# Patient Record
Sex: Female | Born: 1970 | Race: Black or African American | Hispanic: No | Marital: Single | Smoking: Never smoker
Health system: Southern US, Community
[De-identification: ages and names within clinical notes are randomized; demographics above are authoritative.]

---

## 1999-01-06 ENCOUNTER — Emergency Department (HOSPITAL_COMMUNITY): Admission: EM | Admit: 1999-01-06 | Discharge: 1999-01-06 | Payer: Self-pay | Admitting: Emergency Medicine

## 2000-03-07 ENCOUNTER — Emergency Department (HOSPITAL_COMMUNITY): Admission: EM | Admit: 2000-03-07 | Discharge: 2000-03-07 | Payer: Self-pay | Admitting: *Deleted

## 2000-04-21 ENCOUNTER — Emergency Department (HOSPITAL_COMMUNITY): Admission: EM | Admit: 2000-04-21 | Discharge: 2000-04-21 | Payer: Self-pay | Admitting: Emergency Medicine

## 2000-04-21 ENCOUNTER — Encounter: Payer: Self-pay | Admitting: Emergency Medicine

## 2000-07-04 ENCOUNTER — Emergency Department (HOSPITAL_COMMUNITY): Admission: EM | Admit: 2000-07-04 | Discharge: 2000-07-04 | Payer: Self-pay | Admitting: Emergency Medicine

## 2001-08-26 ENCOUNTER — Emergency Department (HOSPITAL_COMMUNITY): Admission: EM | Admit: 2001-08-26 | Discharge: 2001-08-26 | Payer: Self-pay | Admitting: Emergency Medicine

## 2001-08-26 ENCOUNTER — Encounter: Payer: Self-pay | Admitting: Emergency Medicine

## 2021-05-27 ENCOUNTER — Encounter (HOSPITAL_COMMUNITY): Payer: Self-pay | Admitting: Student

## 2021-05-27 ENCOUNTER — Emergency Department (HOSPITAL_COMMUNITY): Payer: No Typology Code available for payment source

## 2021-05-27 ENCOUNTER — Other Ambulatory Visit: Payer: Self-pay

## 2021-05-27 ENCOUNTER — Emergency Department (HOSPITAL_COMMUNITY)
Admission: EM | Admit: 2021-05-27 | Discharge: 2021-05-27 | Disposition: A | Discharge status: Home / Self Care | Payer: No Typology Code available for payment source | Attending: Emergency Medicine | Admitting: Emergency Medicine

## 2021-05-27 DIAGNOSIS — Z23 Encounter for immunization: Secondary | ICD-10-CM | POA: Diagnosis not present

## 2021-05-27 DIAGNOSIS — W230XXA Caught, crushed, jammed, or pinched between moving objects, initial encounter: Secondary | ICD-10-CM | POA: Diagnosis not present

## 2021-05-27 DIAGNOSIS — S42441B Displaced fracture (avulsion) of medial epicondyle of right humerus, initial encounter for open fracture: Secondary | ICD-10-CM | POA: Diagnosis not present

## 2021-05-27 DIAGNOSIS — S51011A Laceration without foreign body of right elbow, initial encounter: Secondary | ICD-10-CM | POA: Insufficient documentation

## 2021-05-27 DIAGNOSIS — Z20822 Contact with and (suspected) exposure to covid-19: Secondary | ICD-10-CM | POA: Diagnosis not present

## 2021-05-27 DIAGNOSIS — Y990 Civilian activity done for income or pay: Secondary | ICD-10-CM | POA: Insufficient documentation

## 2021-05-27 DIAGNOSIS — S61411A Laceration without foreign body of right hand, initial encounter: Secondary | ICD-10-CM | POA: Insufficient documentation

## 2021-05-27 DIAGNOSIS — S4991XA Unspecified injury of right shoulder and upper arm, initial encounter: Secondary | ICD-10-CM | POA: Diagnosis present

## 2021-05-27 LAB — RESP PANEL BY RT-PCR (FLU A&B, COVID) ARPGX2
Influenza A by PCR: NEGATIVE
Influenza B by PCR: NEGATIVE
SARS Coronavirus 2 by RT PCR: NEGATIVE

## 2021-05-27 MED ORDER — TETANUS-DIPHTH-ACELL PERTUSSIS 5-2.5-18.5 LF-MCG/0.5 IM SUSY
0.5000 mL | PREFILLED_SYRINGE | Freq: Once | INTRAMUSCULAR | Status: AC
  Administered 2021-05-27: 0.5 mL via INTRAMUSCULAR
  Filled 2021-05-27: qty 0.5

## 2021-05-27 MED ORDER — CEPHALEXIN 500 MG PO CAPS: 500.0000 mg | ORAL_CAPSULE | Freq: Three times a day (TID) | ORAL | 0 refills | Status: AC

## 2021-05-27 MED ORDER — CEFAZOLIN SODIUM-DEXTROSE 2-4 GM/100ML-% IV SOLN
2.0000 g | Freq: Once | INTRAVENOUS | Status: AC
  Administered 2021-05-27: 2 g via INTRAVENOUS
  Filled 2021-05-27: qty 100

## 2021-05-27 MED ORDER — HYDROMORPHONE HCL 1 MG/ML IJ SOLN
0.5000 mg | Freq: Once | INTRAMUSCULAR | Status: AC
  Administered 2021-05-27: 0.5 mg via INTRAVENOUS
  Filled 2021-05-27: qty 1

## 2021-05-27 MED ORDER — ONDANSETRON 4 MG PO TBDP: 4.0000 mg | ORAL_TABLET | Freq: Three times a day (TID) | ORAL | 0 refills | Status: AC | PRN

## 2021-05-27 MED ORDER — POVIDONE-IODINE 10 % EX SOLN
Freq: Once | CUTANEOUS | Status: AC
  Filled 2021-05-27: qty 118

## 2021-05-27 MED ORDER — ONDANSETRON HCL 4 MG/2ML IJ SOLN
4.0000 mg | Freq: Once | INTRAMUSCULAR | Status: AC
  Administered 2021-05-27: 4 mg via INTRAVENOUS
  Filled 2021-05-27: qty 2

## 2021-05-27 MED ORDER — LIDOCAINE HCL 2 % IJ SOLN
20.0000 mL | Freq: Once | INTRAMUSCULAR | Status: DC
  Filled 2021-05-27: qty 20

## 2021-05-27 MED ORDER — HYDROCODONE-ACETAMINOPHEN 5-325 MG PO TABS: 1.0000 | ORAL_TABLET | Freq: Four times a day (QID) | ORAL | 0 refills | Status: AC | PRN

## 2021-05-27 MED ORDER — LIDOCAINE HCL (PF) 1 % IJ SOLN
INTRAMUSCULAR | Status: AC
  Filled 2021-05-27: qty 30

## 2021-05-27 NOTE — Progress Notes (Signed)
Orthopedic Tech Progress Note Patient Details:  Jaryah Aracena 08-Jan-1971 [id number]  Ortho Devices Type of Ortho Device: Long arm splint, Shoulder immobilizer Ortho Device/Splint Location: RUE Ortho Device/Splint Interventions: Ordered, Application, Adjustment   Post Interventions Patient Tolerated: Well Instructions Provided: Care of device, Poper ambulation with device, Adjustment of device  Jelena Malicoat 05/27/2021, 8:05 AM

## 2021-05-27 NOTE — ED Triage Notes (Signed)
Pt was at work and right arm was caught in a roller, pt has a large laceration to palmar side of right hand, and a smaller laceration to right upper arm. Bleeding controlled, dressing applied.

## 2021-05-27 NOTE — ED Provider Notes (Signed)
[facility] Provider Note   CSN: [id number] Arrival date & time: 05/27/21  0055    History Chief Complaint  Patient presents with   Hand Injury    Diana Sellers is a 50 y.o. female without significant past medical hx who presents to the ED for RUE injury that occurred 30 minutes PTA. Patient reports she was at work when her right upper extremity from the hand to the elbow got caught between two moving rollers resulting in injury. She has wounds to her elbow and more prominently to her hand with associated pain/swelling. Worse with movement. No alleviating factors. Denies numbness, weakness, or other areas of injury. Patient is R hand dominant. Unknown last tetanus. Last food intake was in the afternoon.   HPI     History reviewed. No pertinent past medical history.  There are no problems to display for this patient.   History reviewed. No pertinent surgical history.   OB History   No obstetric history on file.     History reviewed. No pertinent family history.     Home Medications Prior to Admission medications   Not on File    Allergies    Patient has no allergy information on record.  Review of Systems   Review of Systems  Constitutional:  Negative for chills and fever.  Respiratory:  Negative for shortness of breath.   Cardiovascular:  Negative for chest pain.  Gastrointestinal:  Negative for abdominal pain.  Musculoskeletal:  Positive for arthralgias, joint swelling and myalgias.  Skin:  Positive for wound.  Neurological:  Negative for weakness and numbness.  All other systems reviewed and are negative.  Physical Exam Updated Vital Signs BP 134/81   Pulse 75   Temp 98.9 F (37.2 C) (Oral)   Resp 16   SpO2 100%   Physical Exam Vitals and nursing note reviewed.  Constitutional:      General: She is not in acute distress.    Appearance: Normal appearance. She is well-developed. She is not ill-appearing or  toxic-appearing.  HENT:     Head: Normocephalic and atraumatic.  Eyes:     General:        Right eye: No discharge.        Left eye: No discharge.     Conjunctiva/sclera: Conjunctivae normal.  Neck:     Comments: No midline tenderness.  Cardiovascular:     Rate and Rhythm: Normal rate.     Pulses:          Radial pulses are 2+ on the right side and 2+ on the left side.     Comments: 2+ radial and ulnar pulses bilaterally. Pulmonary:     Effort: Pulmonary effort is normal. No respiratory distress.     Breath sounds: Normal breath sounds.  Abdominal:     General: There is no distension.  Musculoskeletal:     Cervical back: Normal range of motion and neck supple.     Comments: Upper extremities: RUE- R hand and elbow are swollen.. Patient has a 4 cm wound to the ulnar aspect of the distal humerus that is approximately 3 mm deep- no obvious palpable bone fragements. Large gaping wound from the ulnar wrist extending to the webspace between the 1st & 2nd digit that is 14 cm and is 1 cm in depth at deepest portion.. Muscle belly at the thenar eminence is visible however appears intact. Some fascia involvement. No obvious tendon injury. Both wounds are without obvious FB and  without pulsatile bleeding, mild ooze from each wound without dressing in place. Patient has full intact ROM to the right elbow and wrist, able to flex/extend all ICP/MCP joints, very mild limitation with full extension as patient states this is due to swelling. Able to flex/extend all MCP/IP joints against resistance. Able to perform OK sign, thumbs up, and cross 2nd/3rd digits bilaterally. Tender to the right eblow, forearm, and hand diffusely. No tenderness to the digits. Compartments are soft.   Skin:    General: Skin is warm and dry.     Capillary Refill: Capillary refill takes less than 2 seconds.  Neurological:     Mental Status: She is alert.     Comments: Clear speech.   Psychiatric:        Mood and Affect: Mood  normal.        Behavior: Behavior normal.        Thought Content: Thought content normal.         ED Results / Procedures / Treatments   Labs (all labs ordered are listed, but only abnormal results are displayed) Labs Reviewed  RESP PANEL BY RT-PCR (FLU A&B, COVID) [id number]    EKG None  Radiology DG Elbow Complete Right  Result Date: 05/27/2021 CLINICAL DATA:  Arm caught in roller with elbow pain, initial encounter EXAM: RIGHT ELBOW - COMPLETE 3+ VIEW COMPARISON:  None. FINDINGS: Fragmentation is noted along the medial epicondyle with associated soft tissue injury consistent with the given clinical history. No other fractures are seen. No other soft tissue abnormality noted. IMPRESSION: Fragmentation of the medial epicondyle with associated soft tissue injury. Electronically Signed   By: Alcide Clever M.D.   On: 05/27/2021 02:26   DG Forearm Right  Result Date: 05/27/2021 CLINICAL DATA:  Right arm caught in roller with pain and soft tissue lacerations, initial encounter EXAM: RIGHT FOREARM - 2 VIEW COMPARISON:  None. FINDINGS: Irregularity is noted along the medial epicondyle of the distal humerus consistent with localized fracture. The radius and ulna are within normal limits. No joint effusion is seen. IMPRESSION: Fracture involving the distal right humerus. No radius or ulnar abnormality is seen. Electronically Signed   By: Alcide Clever M.D.   On: 05/27/2021 02:26   DG Hand Complete Right  Result Date: 05/27/2021 CLINICAL DATA:  Right arm caught in roller with hand and forearm pain, initial encounter EXAM: RIGHT HAND - COMPLETE 3+ VIEW COMPARISON:  None. FINDINGS: Soft tissue injury is noted along the palmar aspect of the hand. No acute fracture or dislocation is noted. No other focal abnormality is seen. IMPRESSION: Soft tissue injury without acute bony abnormality. Electronically Signed   By: Alcide Clever M.D.   On: 05/27/2021 02:25    Procedures .Marland KitchenLaceration Repair  Date/Time:  05/27/2021 6:55 AM Performed by: Cherly Anderson, PA-C Authorized by: Cherly Anderson, PA-C   Consent:    Consent obtained:  Verbal   Consent given by:  Patient   Risks, benefits, and alternatives were discussed: yes     Risks discussed:  Infection, need for additional repair, poor wound healing, poor cosmetic result, pain, retained foreign body, tendon damage, vascular damage and nerve damage   Alternatives discussed:  No treatment Anesthesia:    Anesthesia method:  Local infiltration   Local anesthetic:  Lidocaine 1% w/o epi Laceration details:    Location:  Shoulder/arm   Shoulder/arm location:  R elbow   Length (cm):  4   Depth (mm):  3 Pre-procedure details:  Preparation:  Patient was prepped and draped in usual sterile fashion and imaging obtained to evaluate for foreign bodies Exploration:    Hemostasis achieved with:  Direct pressure   Imaging obtained: x-ray     Imaging outcome: foreign body not noted     Wound exploration: wound explored through full range of motion and entire depth of wound visualized     Contaminated: no   Treatment:    Area cleansed with:  Povidone-iodine   Amount of cleaning:  Standard   Irrigation solution:  Sterile water   Irrigation method:  Pressure wash   Visualized foreign bodies/material removed: no   Skin repair:    Repair method:  Sutures   Suture size:  4-0   Suture material:  Nylon   Suture technique:  Simple interrupted   Number of sutures:  7 Approximation:    Approximation:  Close Repair type:    Repair type:  Simple Post-procedure details:    Dressing:  Non-adherent dressing   Procedure completion:  Tolerated well, no immediate complications .Marland KitchenLaceration Repair  Date/Time: 05/27/2021 6:56 AM Performed by: Cherly Anderson, PA-C Authorized by: Cherly Anderson, PA-C   Consent:    Consent obtained:  Verbal   Consent given by:  Patient   Risks, benefits, and alternatives were discussed: yes      Risks discussed:  Infection, need for additional repair, nerve damage, poor wound healing, poor cosmetic result, pain and retained foreign body   Alternatives discussed:  No treatment Anesthesia:    Anesthesia method:  Local infiltration   Local anesthetic:  Lidocaine 1% w/o epi Laceration details:    Location:  Hand   Hand location:  R palm   Length (cm):  14   Depth (mm):  10 Pre-procedure details:    Preparation:  Imaging obtained to evaluate for foreign bodies and patient was prepped and draped in usual sterile fashion Exploration:    Hemostasis achieved with:  Direct pressure   Imaging obtained: x-ray     Imaging outcome: foreign body not noted     Wound exploration: wound explored through full range of motion and entire depth of wound visualized     Wound extent: fascia violated     Contaminated: no   Treatment:    Area cleansed with:  Povidone-iodine   Amount of cleaning:  Standard   Irrigation solution:  Sterile water   Irrigation volume:  1.5L   Irrigation method:  Pressure wash Skin repair:    Repair method:  Sutures   Suture size:  5-0   Suture material:  Chromic gut   Suture technique:  Simple interrupted   Number of sutures:  25 Repair type:    Repair type:  Intermediate Post-procedure details:    Dressing:  Non-adherent dressing   Procedure completion:  Tolerated well, no immediate complications Comments:     25 simple interrupted chromic gut sutures placed.  Additionally placed 1 horizontal mattress suture with 4-0 nylon to the ventral aspect of the wrist at the base of the palm to help with approximation.   Medications Ordered in ED Medications  lidocaine (PF) (XYLOCAINE) 1 % injection (has no administration in time range)  ceFAZolin (ANCEF) IVPB 2g/100 mL premix (has no administration in time range)  Tdap (BOOSTRIX) injection 0.5 mL (0.5 mLs Intramuscular Given 05/27/21 0157)  HYDROmorphone (DILAUDID) injection 0.5 mg (0.5 mg Intravenous Given 05/27/21 0157)   ondansetron (ZOFRAN) injection 4 mg (4 mg Intravenous Given 05/27/21 0217)    ED  Course  I have reviewed the triage vital signs and the nursing notes.  Pertinent labs & imaging results that were available during my care of the patient were reviewed by me and considered in my medical decision making (see chart for details).    MDM Rules/Calculators/A&P                           Patient presents to the ED with complaints of right upper extremity injury.  Patient has a large complicated laceration to the right palm as well as a small laceration to the right medial distal humerus.  Neurovascularly intact distally.  X-rays ordered.  Tetanus updated.  Additional history obtained:  Additional history obtained from chart review & nursing note review.   Imaging Studies ordered:  I ordered imaging studies which included right hand, forearm, and elbow x-rays, I independently reviewed, formal radiology impression shows:  Right hand:Soft tissue injury without acute bony abnormality. Right forearm: Fracture involving the distal right humerus. No radius or ulnar abnormality is see Right elbow: Fragmentation of the medial epicondyle with associated soft tissue injury  ED Course:  Will discuss with orthopedic surgery regarding open medial epicondyle fracture.  Ancef ordered.  03:12: CONSULT: Discussed with Dr. Jena Gauss, reviewed imaging, does not feel patient needs to go to the operating room for his medial epicondyle fracture-recommends washout in the emergency department with closure utilizing nylon-we will see patient in clinic.  Supervising physician Dr. Manus Gunning discussed with hand surgeon Dr. Arita Miss who has recommended closure in the emergency department with chromic gut sutures, only need for single layer of sutures for closure.   Wounds were repaired per procedure notes above.  Patient tolerated the procedure well.  Neurovascular intact prior to and following laceration repairs.  Exam does not  appear clinically consistent with compartment syndrome at this time, Nonstick dressings were applied with posterior long-arm splint and sling.  Will discharge home with analgesics, antiemetics, and Keflex. [place] Controlled Substance reporting System queried.  Patient to follow-up with Dr. Arita Miss and Dr. Jena Gauss outpatient.  Strict return precautions.  I discussed results, treatment plan, need for follow-up, and return precautions with the patient. Provided opportunity for questions, patient confirmed understanding and is in agreement with plan.    This is a shared visit with supervising physician Dr. Manus Gunning who has independently evaluated patient & provided guidance in evaluation/management/disposition, in agreement with care   Portions of this note were generated with Dragon dictation software. Dictation errors may occur despite best attempts at proofreading.  Final Clinical Impression(s) / ED Diagnoses Final diagnoses:  Laceration of right palm, initial encounter  Laceration of right elbow, initial encounter  Open displaced fracture of medial epicondyle of right humerus, unspecified fracture morphology, initial encounter    Rx / DC Orders ED Discharge Orders          Ordered    HYDROcodone-acetaminophen (NORCO/VICODIN) 5-325 MG tablet  Every 6 hours PRN        05/27/21 0653    ondansetron (ZOFRAN ODT) 4 MG disintegrating tablet  Every 8 hours PRN        05/27/21 0653    cephALEXin (KEFLEX) 500 MG capsule  3 times daily        05/27/21 0653             Cherly Anderson, PA-C 05/27/21 0706    Glynn Octave, MD 05/28/21 [phone]

## 2021-05-27 NOTE — ED Notes (Signed)
Nonadherent and cling wrap dressing applied to wound.

## 2021-05-27 NOTE — Discharge Instructions (Addendum)
Please read and follow all provided instructions.  You have been seen today after an injury to your right upper extremity Your xray shows that you have fractured your medial epicondyle of your right humerus-this is near the funny bone location. You also had wounds to your right elbow and to your right hand.  Your right elbow wound was closed with nonabsorbable sutures, these will need to be removed in 7 to 10 days.  Your right hand wound was closed with absorbable sutures, these will come out on their own- there is one non-absorbable stitch in your hand that will need to be removed in 7-10 days.  We have placed dressings on the wounds underneath your splint. We have placed you in a splint- please keep this clean & dry and intact until you have followed up with the below specialist. Please call Dr. Thomos Lemons office with plastic surgery tomorrow morning to schedule soonest available follow-up for further evaluation and management of your complicated right hand wound, please call Dr. Luvenia Starch office with orthopedics tomorrow morning to schedule soonest available appointment for further management of your right elbow wound and fracture.  Home care instructions: -- *PRICE in the first 24-48 hours after injury: Protect with splint Rest Ice- Do not apply ice pack directly to your splint place towel or similar between your splint and ice/ice pack. Apply ice for 20 min, then remove for 40 min while awake Compression- splint Elevate affected extremity above the level of your heart when not walking around for the first 24-48 hours   Medications:  Please take ibuprofen per over the counter dosing to help with pain/swelling.  If your pain is not alleviated by ibuprofen please take percocet.  -Norco-this is a narcotic/controlled substance medication that has potential addicting qualities.  We recommend that you take 1-2 tablets every 6 hours as needed for severe pain.  Do not drive or operate heavy machinery when  taking this medicine as it can be sedating. Do not drink alcohol or take other sedating medications when taking this medicine for safety reasons.  Keep this out of reach of small children.  Please be aware this medicine has Tylenol in it (325 mg/tab) do not exceed the maximum dose of Tylenol in a day per over the counter recommendations should you decide to supplement with Tylenol over the counter.  - Zofran-this is an antinausea medication, please take this every 8 hours as needed. -Keflex- this is an antibiotic to help prevent infection.  Please take this 3 times per day for the next 7 days.  We have prescribed you new medication(s) today. Discuss the medications prescribed today with your pharmacist as they can have adverse effects and interactions with your other medicines including over the counter and prescribed medications. Seek medical evaluation if you start to experience new or abnormal symptoms after taking one of these medicines, seek care immediately if you start to experience difficulty breathing, feeling of your throat closing, facial swelling, or rash as these could be indications of a more serious allergic reaction   Follow-up instructions: Please call Dr. Thomos Lemons and Dr. Luvenia Starch office as above.   Return instructions:  Please return if your digits or extremity are numb or tingling, appear gray or blue, or you have severe pain (also elevate the extremity and loosen splint or wrap if you were given one) Please return if you have redness or fevers.  Please return to the Emergency Department if you experience worsening symptoms.  Please return if you have any  other emergent concerns. Additional Information:  Your vital signs today were: BP (!) 142/90   Pulse 79   Temp 98.9 F (37.2 C) (Oral)   Resp 18   SpO2 100%  If your blood pressure (BP) was elevated above 135/85 this visit, please have this repeated by your doctor within one month. ---------------

## 2022-01-05 ENCOUNTER — Other Ambulatory Visit: Payer: Self-pay

## 2022-01-05 ENCOUNTER — Encounter (HOSPITAL_COMMUNITY): Payer: Self-pay

## 2022-01-05 ENCOUNTER — Emergency Department (HOSPITAL_COMMUNITY)
Admission: EM | Admit: 2022-01-05 | Discharge: 2022-01-05 | Discharge status: Against Medical Advice | Payer: No Typology Code available for payment source | Attending: Emergency Medicine | Admitting: Emergency Medicine

## 2022-01-05 DIAGNOSIS — Z5321 Procedure and treatment not carried out due to patient leaving prior to being seen by health care provider: Secondary | ICD-10-CM | POA: Diagnosis not present

## 2022-01-05 DIAGNOSIS — M79641 Pain in right hand: Secondary | ICD-10-CM | POA: Insufficient documentation

## 2022-01-05 NOTE — ED Notes (Signed)
Patient gave registration their labels and stated they were leaving ?

## 2022-01-05 NOTE — ED Triage Notes (Signed)
Pt arrived POV from home c/o chronic hand pain for a couple months.  ?

## 2022-01-05 NOTE — ED Provider Triage Note (Signed)
Emergency Medicine Provider Triage Evaluation Note ? ?Jalissa Heinzelman , a 51 y.o. female  was evaluated in triage.  Pt complains of chronic right hand pain.  Patient had a second injury in the month of August 2022, reports she has not been returning back to work.  Now is having worsening pain to the right hand, although there was no injury that occurred.  She does report missing work today, was told that she would need a doctor's note in order to return back to work for her ongoing hand pain. ? ?Review of Systems  ?Positive: Right hand pain ?Negative: Trauma, fever ? ?Physical Exam  ?There were no vitals taken for this visit. ?Gen:   Awake, no distress   ?Resp:  Normal effort  ?MSK:   Moves extremities without difficulty  ?Other:  Decrease hand flexion, pulses present, capillary refill intact, surgical scar present ? ?Medical Decision Making  ?Medically screening exam initiated at 2:43 PM.  Appropriate orders placed.  Kendi Defalco was informed that the remainder of the evaluation will be completed by another provider, this initial triage assessment does not replace that evaluation, and the importance of remaining in the ED until their evaluation is complete. ? ? ?  ?Claude Manges, PA-C ?01/05/22 1454 ? ?

## 2022-01-06 ENCOUNTER — Ambulatory Visit (HOSPITAL_COMMUNITY)
Admission: EM | Admit: 2022-01-06 | Discharge: 2022-01-06 | Disposition: A | Discharge status: Home / Self Care | Payer: Self-pay | Attending: Family Medicine | Admitting: Family Medicine

## 2022-01-06 ENCOUNTER — Encounter (HOSPITAL_COMMUNITY): Payer: Self-pay | Admitting: Emergency Medicine

## 2022-01-06 ENCOUNTER — Other Ambulatory Visit: Payer: Self-pay

## 2022-01-06 DIAGNOSIS — M79641 Pain in right hand: Secondary | ICD-10-CM

## 2022-01-06 NOTE — ED Provider Notes (Signed)
? ?  [facility] ?Provider Note ? ?Patient Contact: 1:41 PM (approximate) ? ? ?History  ? ?Hand Pain ? ? ?HPI ? ?Diana Sellers is a 51 y.o. female presents to the urgent care for a return to work note.  Patient has no current medical complaints right now. ? ?  ? ? ?Physical Exam  ? ?Triage Vital Signs: ?ED Triage Vitals  ?Enc Vitals Group  ?   BP 01/06/22 1331 (!) 165/107  ?   Pulse Rate 01/06/22 1331 81  ?   Resp 01/06/22 1331 18  ?   Temp 01/06/22 1331 98.3 ?F (36.8 ?C)  ?   Temp Source 01/06/22 1331 Oral  ?   SpO2 01/06/22 1331 99 %  ?   Weight --   ?   Height --   ?   Head Circumference --   ?   Peak Flow --   ?   Pain Score 01/06/22 1328 6  ?   Pain Loc --   ?   Pain Edu? --   ?   Excl. in GC? --   ? ? ?Most recent vital signs: ?Vitals:  ? 01/06/22 1331  ?BP: (!) 165/107  ?Pulse: 81  ?Resp: 18  ?Temp: 98.3 ?F (36.8 ?C)  ?SpO2: 99%  ? ? ? ?General: Alert and in no acute distress. ?Eyes:  PERRL. EOMI. ?Head: No acute traumatic findings ?ENT: ?     Ears:  ?     Nose: No congestion/rhinnorhea. ?     Mouth/Throat: Mucous membranes are moist.  ?Neck: No stridor. No cervical spine tenderness to palpation. ?Cardiovascular:  Good peripheral perfusion ?Respiratory: Normal respiratory effort without tachypnea or retractions. Lungs CTAB. Good air entry to the bases with no decreased or absent breath sounds. ?Gastrointestinal: Bowel sounds ?4 quadrants. Soft and nontender to palpation. No guarding or rigidity. No palpable masses. No distention. No CVA tenderness. ?Musculoskeletal: Full range of motion to all extremities.  ?Neurologic:  No gross focal neurologic deficits are appreciated.  ?Skin:   No rash noted ?Other: ? ? ?ED Results / Procedures / Treatments  ? ?Labs ?(all labs ordered are listed, but only abnormal results are displayed) ?Labs Reviewed - No data to display ? ? ? ? ? ? ? ?PROCEDURES: ? ?Critical Care performed: No ? ?Procedures ? ? ?MEDICATIONS ORDERED IN ED: ?Medications - No data to  display ? ? ?IMPRESSION / MDM / ASSESSMENT AND PLAN / ED COURSE  ?I reviewed the triage vital signs and the nursing notes. ?             ?               ? ?Differential diagnosis includes, but is not limited to, return to work evaluation ?51 year old female presents to the urgent care for a return to work note.  Work note was given. ?  ? ? ?FINAL CLINICAL IMPRESSION(S) / ED DIAGNOSES  ? ?Final diagnoses:  ?Right hand pain  ? ? ? ?Rx / DC Orders  ? ?ED Discharge Orders   ? ? None  ? ?  ? ? ? ?Note:  This document was prepared using Dragon voice recognition software and may include unintentional dictation errors. ?  ?Orvil Feil, PA-C ?01/06/22 1343 ? ?

## 2022-01-06 NOTE — ED Triage Notes (Signed)
Patient has an old injury to the right hand.  Right hand pain is reported as being constant since injury.  Patient has sensation of pins and needles.  Patient has an appt on 4/18.  Patient is requesting a note to go back to work.  States she did not go yesterday and she is not going tonight.  ?

## 2022-04-29 IMAGING — DX DG ELBOW COMPLETE 3+V*R*
4 series · 4 of 4 positions shown · non-contrast
Comparison: None.

CLINICAL DATA: Arm caught in roller with elbow pain, initial
encounter

EXAM:
RIGHT ELBOW - COMPLETE 3+ VIEW

[elbow ap]
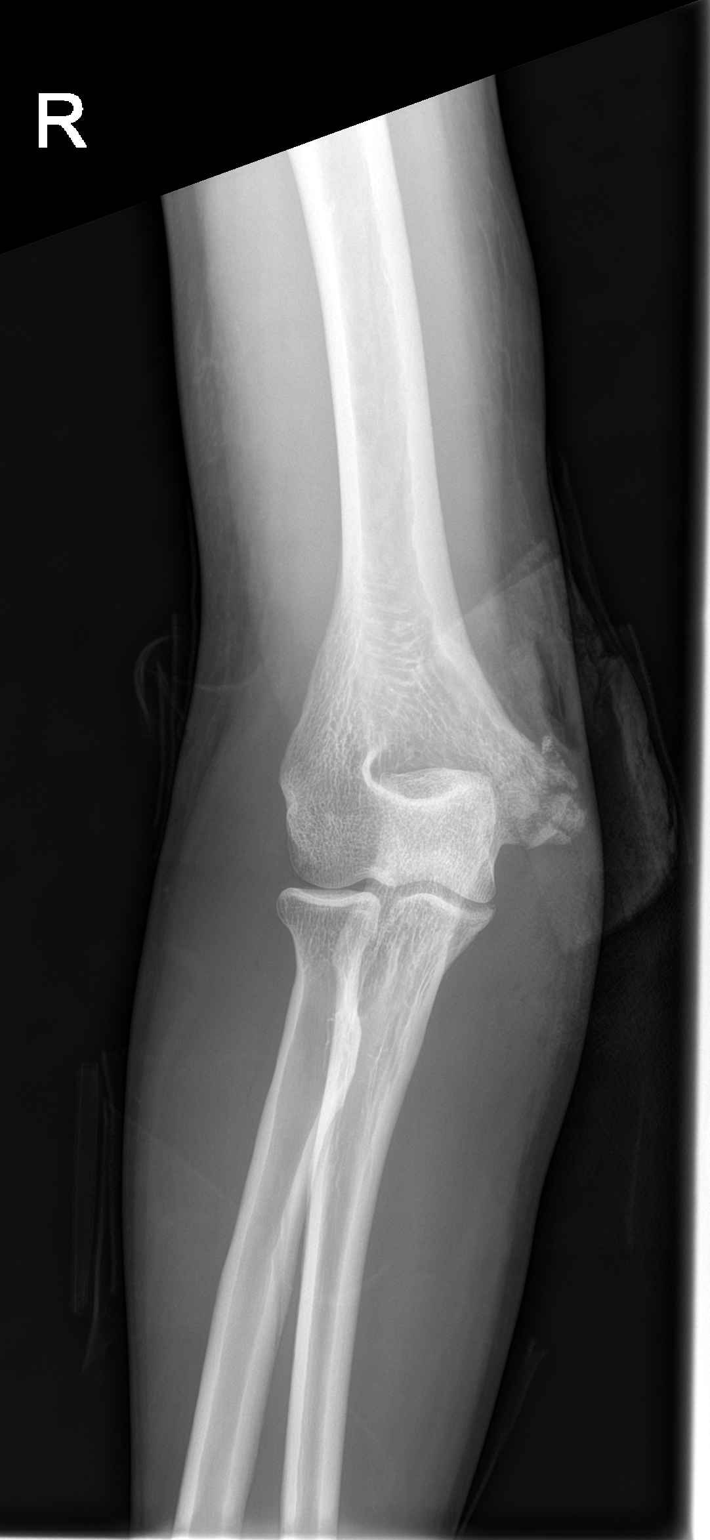

[elbow obl (1 of 2)]
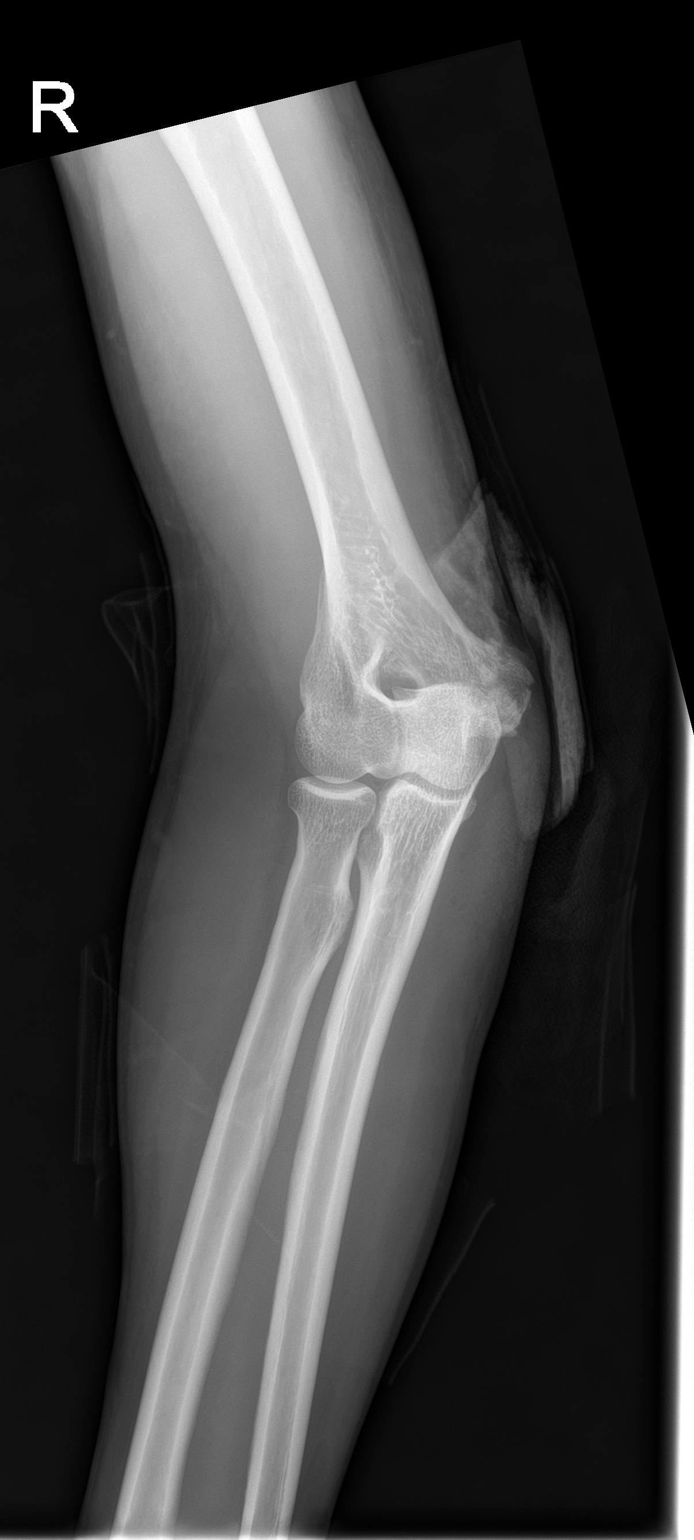

[elbow obl (2 of 2)]
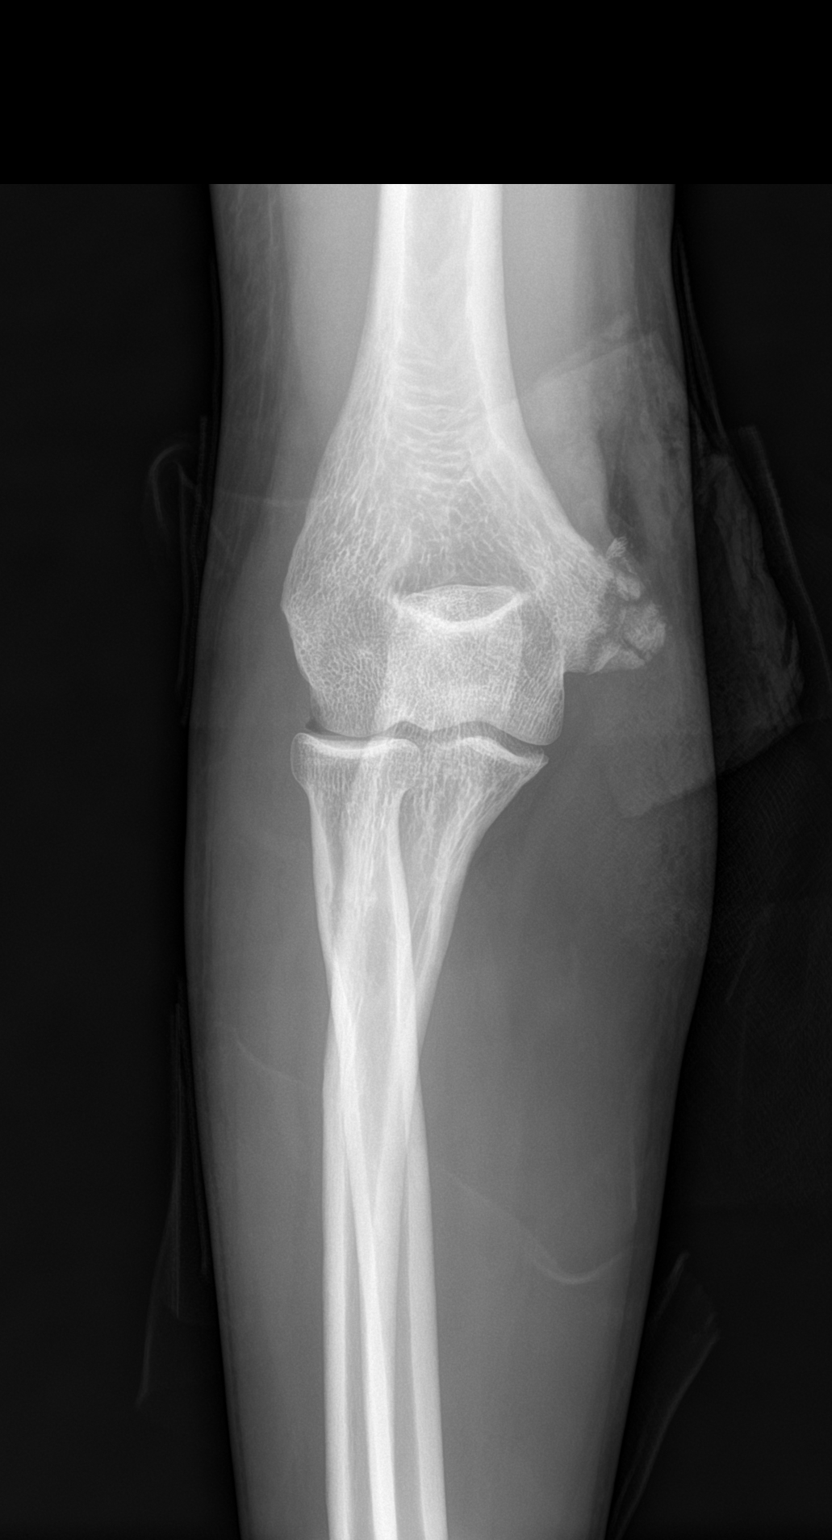

[elbow lat]
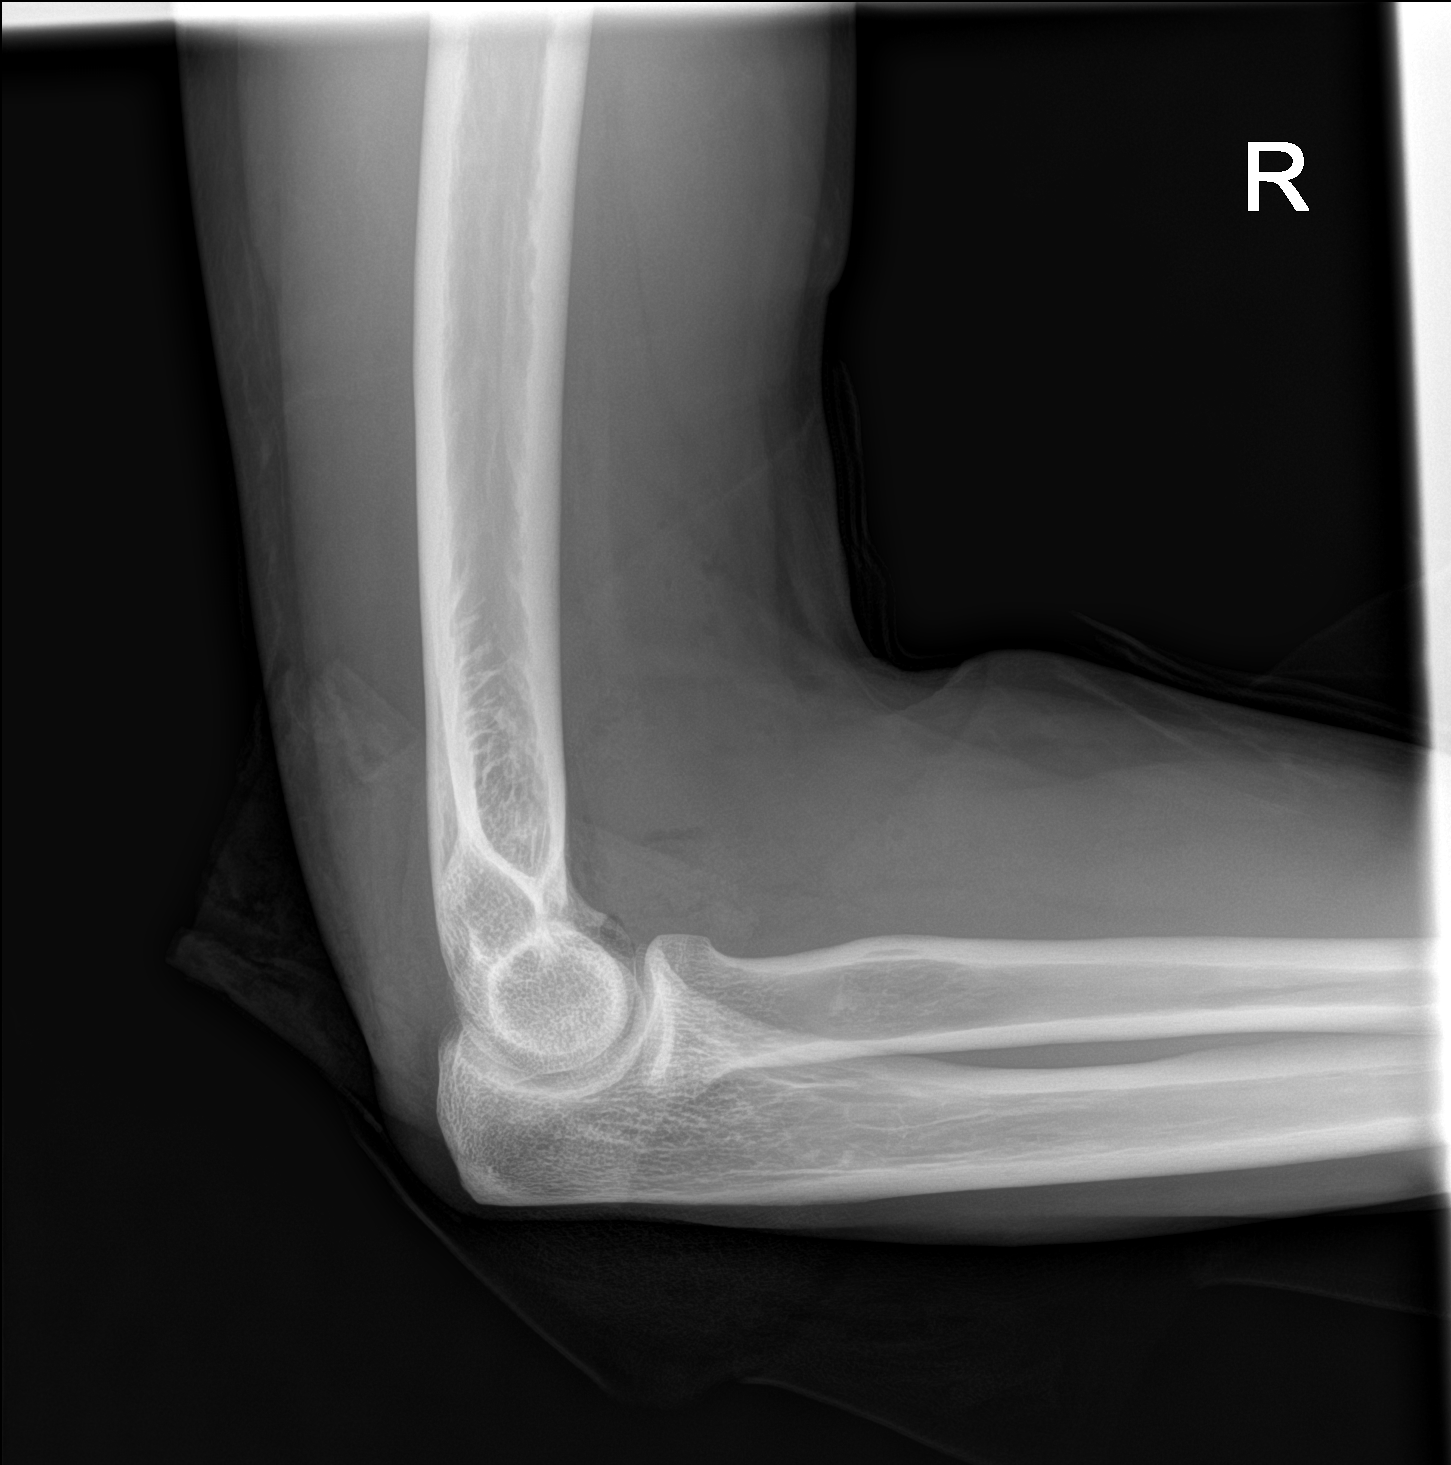

[4 of 4 positions shown; findings below may reference images not displayed]

FINDINGS: Fragmentation is noted along the medial epicondyle with associated
soft tissue injury consistent with the given clinical history. No
other fractures are seen. No other soft tissue abnormality noted.
IMPRESSION: Fragmentation of the medial epicondyle with associated soft tissue
injury.

## 2022-04-29 IMAGING — DX DG FOREARM 2V*R*
2 series · 2 of 2 positions shown · non-contrast
Comparison: None.

CLINICAL DATA: Right arm caught in roller with pain and soft tissue
lacerations, initial encounter

EXAM:
RIGHT FOREARM - 2 VIEW

[forearm ap]
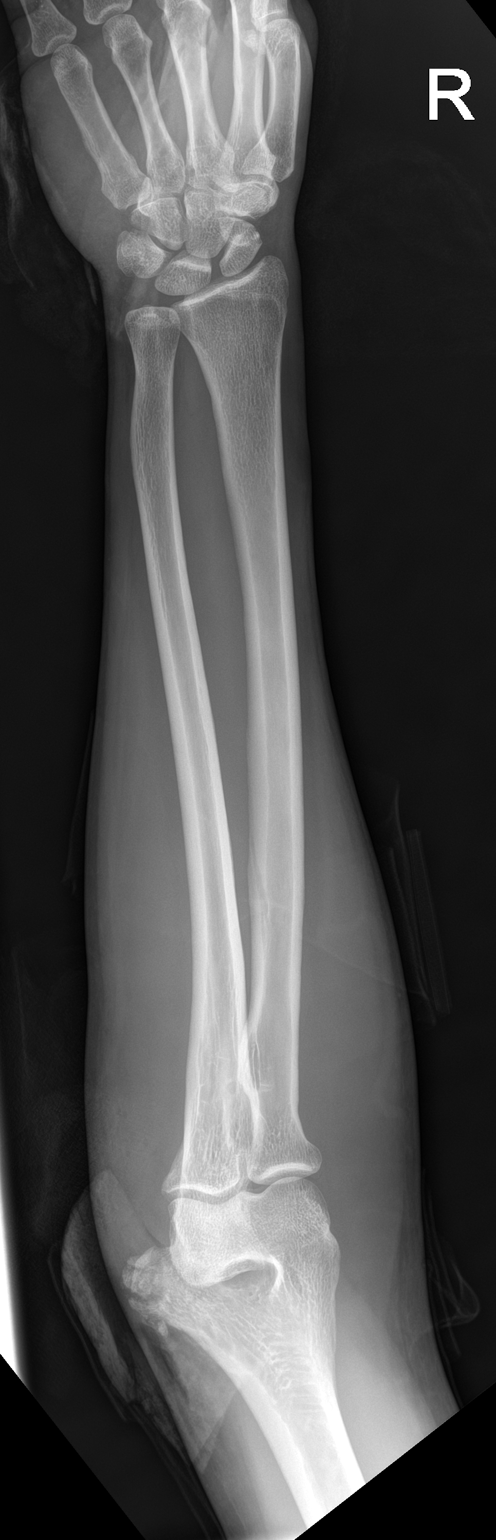

[forearm lat]
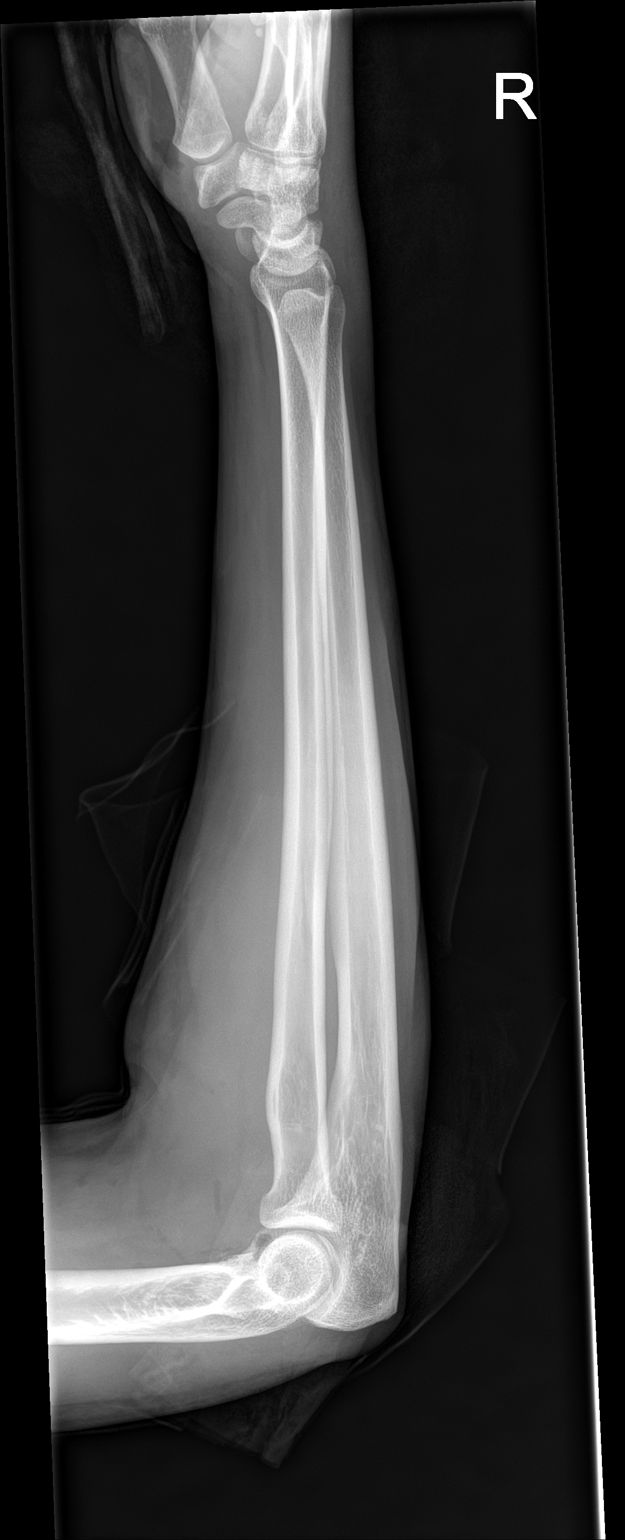

[2 of 2 positions shown; findings below may reference images not displayed]

FINDINGS: Irregularity is noted along the medial epicondyle of the distal
humerus consistent with localized fracture. The radius and ulna are
within normal limits. No joint effusion is seen.
IMPRESSION: Fracture involving the distal right humerus. No radius or ulnar
abnormality is seen.
